# Patient Record
Sex: Female | Born: 1990 | Hispanic: No | Marital: Single | Smoking: Never smoker
Health system: Southern US, Community
[De-identification: ages and names within clinical notes are randomized; demographics above are authoritative.]

---
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
## 2012-01-11 ENCOUNTER — Ambulatory Visit (INDEPENDENT_AMBULATORY_CARE_PROVIDER_SITE_OTHER): Payer: Medicaid Other | Admitting: Family Medicine

## 2012-01-11 ENCOUNTER — Encounter: Payer: Self-pay | Admitting: Family Medicine

## 2012-01-11 VITALS — BP 104/71 | HR 76 | Ht 67.0 in | Wt 175.0 lb

## 2012-01-11 DIAGNOSIS — M25569 Pain in unspecified knee: Secondary | ICD-10-CM

## 2012-01-11 DIAGNOSIS — S82009B Unspecified fracture of unspecified patella, initial encounter for open fracture type I or II: Secondary | ICD-10-CM

## 2012-01-11 DIAGNOSIS — Z00129 Encounter for routine child health examination without abnormal findings: Secondary | ICD-10-CM

## 2012-01-11 NOTE — Patient Instructions (Addendum)
Thank you for coming in today, it was good to see you We will put in a referral for PT and they will contact you to set that up.  F/u in one year or sooner as needed.
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
## 2012-01-14 ENCOUNTER — Ambulatory Visit (INDEPENDENT_AMBULATORY_CARE_PROVIDER_SITE_OTHER): Payer: Medicaid Other | Admitting: *Deleted

## 2012-01-14 DIAGNOSIS — Z289 Immunization not carried out for unspecified reason: Secondary | ICD-10-CM

## 2012-01-14 NOTE — Progress Notes (Signed)
Patient and father in to update immunizations.  Explained that she needs second Varicella , Hep A and   Menactra. Explained these are not required for college form,  but are recommended.  She declines to have immunizations today. Shot record given along with form signed by Dr. Ashley Royalty.

## 2012-01-18 ENCOUNTER — Ambulatory Visit: Payer: Medicaid Other | Attending: Family Medicine

## 2012-01-18 DIAGNOSIS — M25569 Pain in unspecified knee: Secondary | ICD-10-CM | POA: Insufficient documentation

## 2012-01-18 DIAGNOSIS — IMO0001 Reserved for inherently not codable concepts without codable children: Secondary | ICD-10-CM | POA: Insufficient documentation

## 2012-01-18 DIAGNOSIS — R5381 Other malaise: Secondary | ICD-10-CM | POA: Insufficient documentation

## 2012-01-21 ENCOUNTER — Ambulatory Visit: Payer: Medicaid Other | Attending: Family Medicine

## 2012-01-21 DIAGNOSIS — R5381 Other malaise: Secondary | ICD-10-CM | POA: Insufficient documentation

## 2012-01-21 DIAGNOSIS — M25569 Pain in unspecified knee: Secondary | ICD-10-CM | POA: Insufficient documentation

## 2012-01-21 DIAGNOSIS — IMO0001 Reserved for inherently not codable concepts without codable children: Secondary | ICD-10-CM | POA: Insufficient documentation

## 2012-01-25 ENCOUNTER — Ambulatory Visit (INDEPENDENT_AMBULATORY_CARE_PROVIDER_SITE_OTHER): Payer: Medicaid Other | Admitting: *Deleted

## 2012-01-25 ENCOUNTER — Ambulatory Visit: Payer: Medicaid Other | Admitting: Physical Therapy

## 2012-01-25 DIAGNOSIS — Z111 Encounter for screening for respiratory tuberculosis: Secondary | ICD-10-CM

## 2012-01-27 ENCOUNTER — Ambulatory Visit (INDEPENDENT_AMBULATORY_CARE_PROVIDER_SITE_OTHER): Payer: Medicaid Other | Admitting: *Deleted

## 2012-01-27 DIAGNOSIS — IMO0001 Reserved for inherently not codable concepts without codable children: Secondary | ICD-10-CM

## 2012-01-27 DIAGNOSIS — Z111 Encounter for screening for respiratory tuberculosis: Secondary | ICD-10-CM

## 2012-01-27 LAB — TB SKIN TEST: Induration: 0 mm

## 2012-01-28 ENCOUNTER — Ambulatory Visit: Payer: Medicaid Other

## 2012-02-01 ENCOUNTER — Ambulatory Visit: Payer: Medicaid Other | Admitting: Physical Therapy

## 2012-02-04 ENCOUNTER — Ambulatory Visit: Payer: Medicaid Other

## 2012-02-07 ENCOUNTER — Ambulatory Visit: Payer: Medicaid Other

## 2012-02-08 ENCOUNTER — Encounter: Payer: Medicaid Other | Admitting: Physical Therapy

## 2012-02-09 ENCOUNTER — Ambulatory Visit: Payer: Medicaid Other

## 2012-02-14 ENCOUNTER — Ambulatory Visit: Payer: Medicaid Other | Admitting: Physical Therapy

## 2012-02-15 ENCOUNTER — Encounter: Payer: Medicaid Other | Admitting: Physical Therapy

## 2012-02-16 ENCOUNTER — Ambulatory Visit: Payer: Medicaid Other | Admitting: Physical Therapy

## 2012-02-18 ENCOUNTER — Ambulatory Visit: Payer: Medicaid Other

## 2013-05-24 IMAGING — CR DG ANKLE PORT 2V*R*
2 series · 2 of 2 positions shown · non-contrast
Comparison: None.

CLINICAL DATA: Right ankle swelling.

PORTABLE RIGHT ANKLE - 2 VIEW

[view not recorded (1 of 2)]
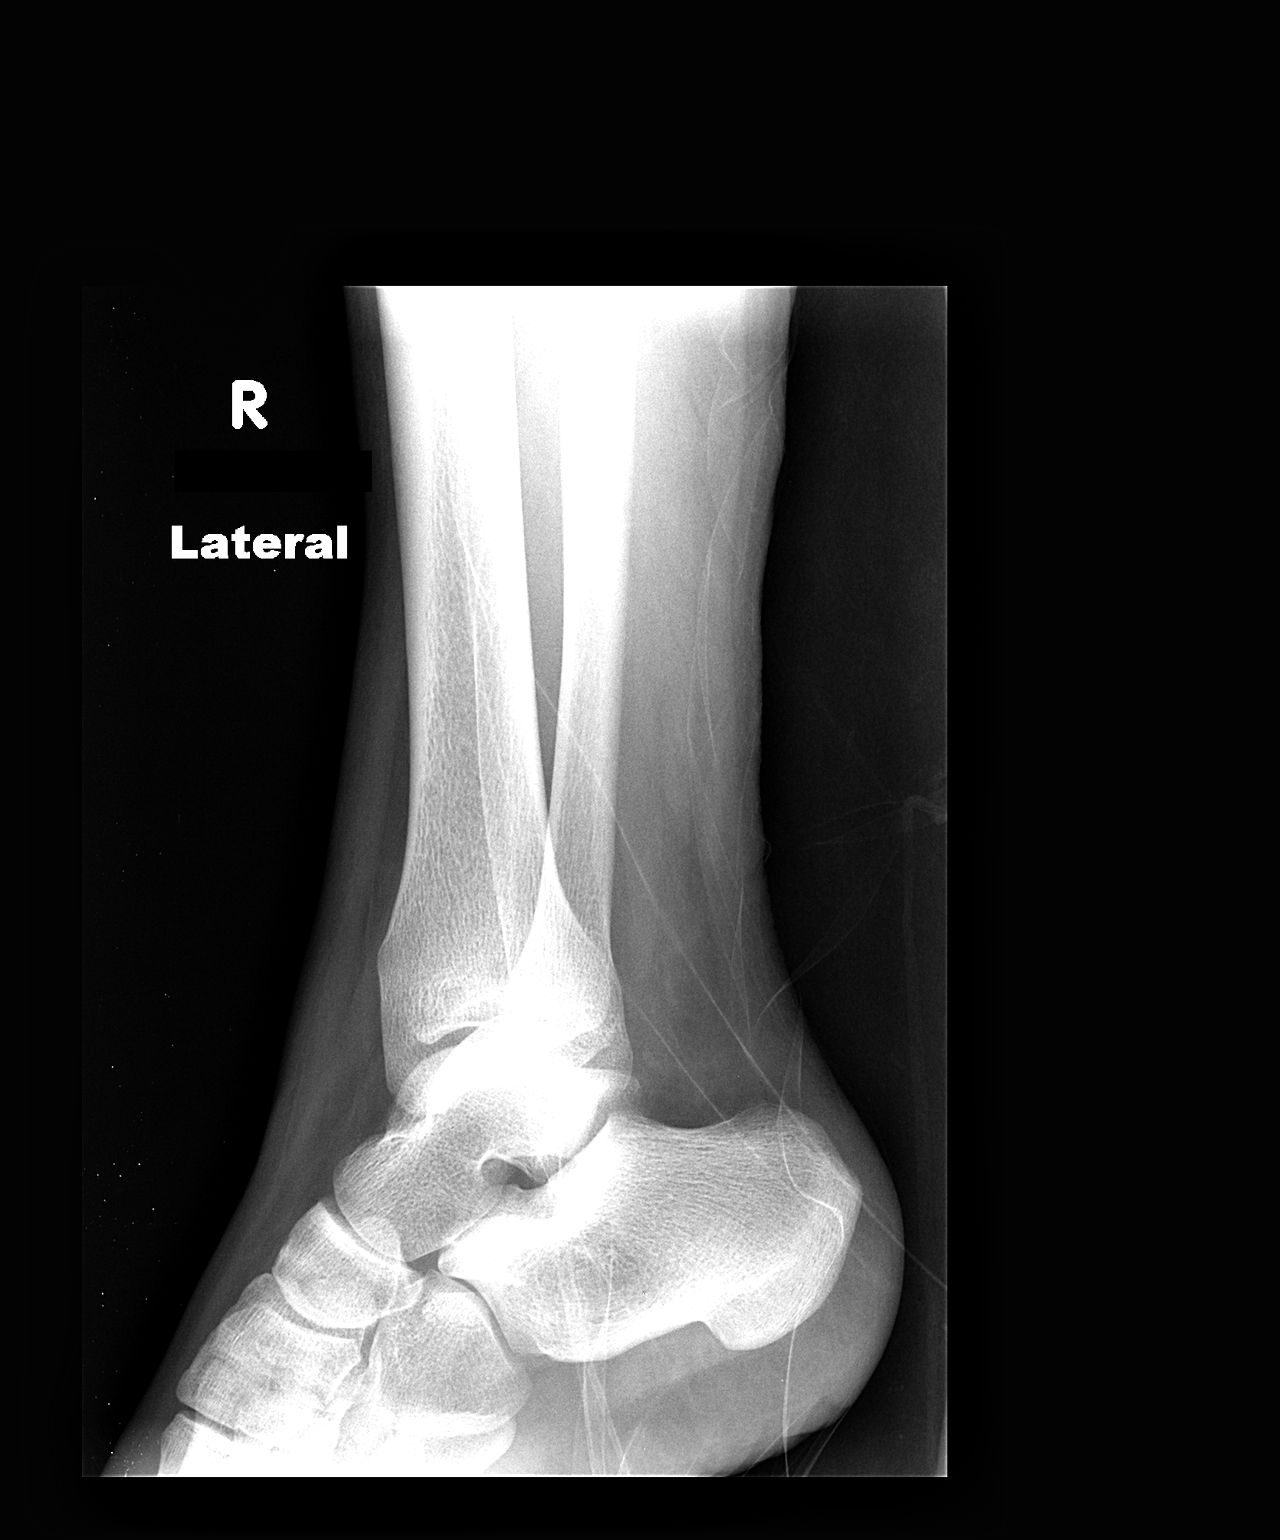

[view not recorded (2 of 2)]
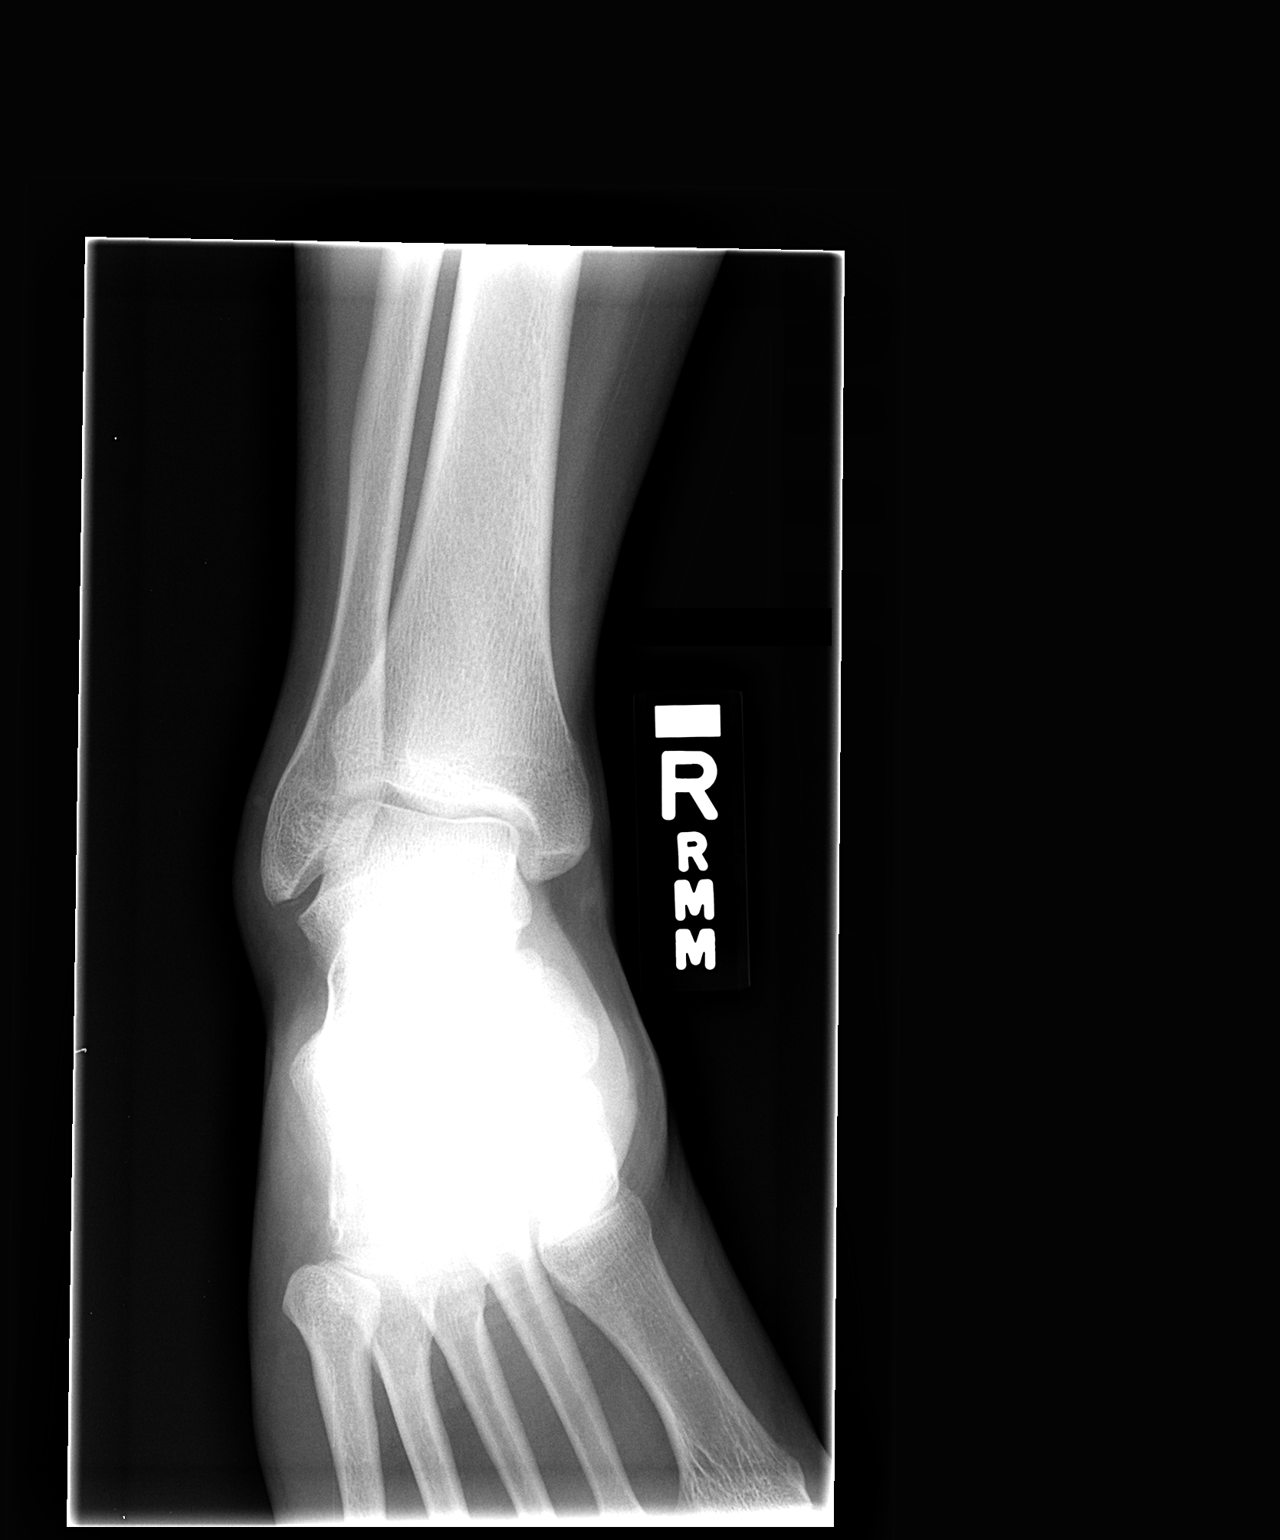

[2 of 2 positions shown; findings below may reference images not displayed]

FINDINGS: No evidence of fracture or dislocation.  No other
significant bone abnormality identified.  Soft tissues are
unremarkable.
IMPRESSION: Negative.

## 2013-11-09 ENCOUNTER — Ambulatory Visit (INDEPENDENT_AMBULATORY_CARE_PROVIDER_SITE_OTHER): Payer: BC Managed Care – PPO | Admitting: Family Medicine

## 2013-11-09 ENCOUNTER — Encounter: Payer: Self-pay | Admitting: Family Medicine

## 2013-11-09 VITALS — BP 105/73 | HR 71 | Temp 98.1°F | Ht 67.0 in | Wt 169.0 lb

## 2013-11-09 DIAGNOSIS — R300 Dysuria: Secondary | ICD-10-CM

## 2013-11-09 DIAGNOSIS — L293 Anogenital pruritus, unspecified: Secondary | ICD-10-CM

## 2013-11-09 DIAGNOSIS — L292 Pruritus vulvae: Secondary | ICD-10-CM

## 2013-11-09 LAB — POCT URINALYSIS DIPSTICK
Bilirubin, UA: NEGATIVE
GLUCOSE UA: NEGATIVE
Ketones, UA: NEGATIVE
NITRITE UA: NEGATIVE
Protein, UA: NEGATIVE
SPEC GRAV UA: 1.025
UROBILINOGEN UA: 0.2
pH, UA: 7

## 2013-11-09 LAB — POCT UA - MICROSCOPIC ONLY

## 2013-11-09 MED ORDER — MICONAZOLE NITRATE 2 % VA CREA: 1.0000 | TOPICAL_CREAM | Freq: Every day | VAGINAL | Status: AC

## 2013-11-09 NOTE — Patient Instructions (Addendum)
Use medication (monistat) as prescribed. If you develop burning with urination, flank pain, nausea or vomiting please come for re-evaluation.

## 2013-11-09 NOTE — Progress Notes (Signed)
Family Medicine Office Visit Note   Subjective:   Patient ID: [name], female  DOB: 29-Oct-1990, 23 y.o.. MRN: [id number]   Pt that comes today complaining of vulvar itching. She reports dysuria yesterday but this has completely resolved. She is finalizing her menses and denies frequency, flank pain, nausea or vomiting. No fevers, chills or vaginal discharge.  Review of Systems:  Per HPI  Objective:   Physical Exam: Gen:  NAD HEENT: Moist mucous membranes  CV: Regular rate and rhythm, no murmurs rubs or gallops PULM: Clear to auscultation bilaterally.  ABD: Soft, non tender, non distended, normal bowel sounds. No CVA tenderness. EXT: No edema Neuro: Alert and oriented x3. No focalization GYN: declined.   Assessment & Plan:

## 2013-11-09 NOTE — Assessment & Plan Note (Signed)
UA w/ WBC and RBC's but pt is finalizing her menses, this can be contamination of her clean catch. Asymptomatic today.  No treatment needed at this time but discussed signs for which treatment would be recommended.

## 2013-11-09 NOTE — Assessment & Plan Note (Signed)
Denies discharge, declines examination.  Monistat cream prescribed.

## 2016-12-02 ENCOUNTER — Encounter (HOSPITAL_COMMUNITY): Payer: Self-pay | Admitting: Nurse Practitioner

## 2016-12-02 ENCOUNTER — Emergency Department (HOSPITAL_COMMUNITY): Payer: Worker's Compensation

## 2016-12-02 ENCOUNTER — Ambulatory Visit: Payer: Self-pay | Admitting: Family Medicine

## 2016-12-02 ENCOUNTER — Emergency Department (HOSPITAL_COMMUNITY)
Admission: EM | Admit: 2016-12-02 | Discharge: 2016-12-02 | Disposition: A | Discharge status: Home / Self Care | Payer: Worker's Compensation | Attending: Emergency Medicine | Admitting: Emergency Medicine

## 2016-12-02 DIAGNOSIS — W268XXA Contact with other sharp object(s), not elsewhere classified, initial encounter: Secondary | ICD-10-CM | POA: Insufficient documentation

## 2016-12-02 DIAGNOSIS — S91331A Puncture wound without foreign body, right foot, initial encounter: Secondary | ICD-10-CM

## 2016-12-02 DIAGNOSIS — Y929 Unspecified place or not applicable: Secondary | ICD-10-CM | POA: Insufficient documentation

## 2016-12-02 DIAGNOSIS — Y999 Unspecified external cause status: Secondary | ICD-10-CM | POA: Insufficient documentation

## 2016-12-02 DIAGNOSIS — Z23 Encounter for immunization: Secondary | ICD-10-CM | POA: Insufficient documentation

## 2016-12-02 DIAGNOSIS — Y939 Activity, unspecified: Secondary | ICD-10-CM | POA: Diagnosis not present

## 2016-12-02 DIAGNOSIS — S99921A Unspecified injury of right foot, initial encounter: Secondary | ICD-10-CM | POA: Diagnosis present

## 2016-12-02 MED ORDER — TETANUS-DIPHTH-ACELL PERTUSSIS 5-2.5-18.5 LF-MCG/0.5 IM SUSP
0.5000 mL | Freq: Once | INTRAMUSCULAR | Status: AC
  Administered 2016-12-02: 0.5 mL via INTRAMUSCULAR
  Filled 2016-12-02: qty 0.5

## 2016-12-02 NOTE — Discharge Instructions (Signed)
Take Ibuprofen or Naproxen for pain as needed Keep area clean and dry and use an antibiotic ointment daily Use crutches as needed. Once you can put weight on your foot you don't have to use them anymore [facility] if you continue to have symptoms after a week

## 2016-12-02 NOTE — ED Triage Notes (Signed)
Pt presents with c/o right foot injury. The injury occurred this afternoon when she stepped on a food skewer which became impaled in the bottom of her foot. She removed the skewer and wrapped with gauze. She reports severe pain, numbness, tingling now

## 2016-12-02 NOTE — ED Provider Notes (Signed)
[facility]-EMERGENCY DEPT Provider Note   CSN: [id number] Arrival date & time: 12/02/16  1511  By signing my name below, I, [name], attest that this documentation has been prepared under the direction and in the presence of Terance Hart, PA-C. Electronically Signed: [name], ED Scribe. 12/02/16. 4:44 PM.  History   Chief Complaint Chief Complaint  Patient presents with  . Foot Injury    HPI [name] is a 26 y.o. female who presents to the Emergency Department complaining of moderate right foot injury that occurred ~ 1:40 pm at work. 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 She removed the skewer and wrapped her foot in gauze after cleaning the area with alcohol. Pt reports numbness and tingling. Pt was not able to ambulate after onset occurred. Only takes BCP. Last tetanus shot was in the summer of 2013. Pt denies fever or any other sx at this time.   The history is provided by the patient. No language interpreter was used.    History reviewed. No pertinent past medical history.  Patient Active Problem List   Diagnosis Date Noted  . Vulvar itching 11/09/2013  . Dysuria 11/09/2013  . Fracture, patella, open 07/16/2011  . CONSTIPATION 01/16/2010  . COSTOCHONDRITIS 09/03/2009    Past Surgical History:  Procedure Laterality Date  . IRRIGATION AND DEBRIDEMENT KNEE  07/16/2011   Procedure: IRRIGATION AND DEBRIDEMENT KNEE;  Surgeon: Verlee Rossetti, MD;  Location: [facility] OR;  Service: Orthopedics;  Laterality: Left;    OB History    No data available       Home Medications    Prior to Admission medications   Medication Sig Start Date End Date Taking? Authorizing Provider  miconazole (MONISTAT 7) 2 % vaginal cream Place 1 Applicatorful vaginally at bedtime. 11/09/13   [place] Criselda Peaches, Dayarmys, MD  polyethylene glycol [facility]) powder Take 17 g by mouth daily as  needed. For constipation    [provider]    Family History History reviewed. No pertinent family history.  Social History Social History  Substance Use Topics  . Smoking status: Never Smoker  . Smokeless tobacco: Never Used  . Alcohol use Yes     Allergies   Patient has no known allergies.   Review of Systems Review of Systems  Constitutional: Negative for fever.  Skin: Positive for wound.  Neurological: Positive for numbness.     Physical Exam Updated Vital Signs BP (!) 132/114   Pulse 87   Temp 98.3 F (36.8 C) (Oral)   Resp 18   SpO2 99%   Physical Exam  Constitutional: She is oriented to person, place, and time. She appears well-developed and well-nourished. No distress.  HENT:  Head: Normocephalic and atraumatic.  Eyes: Conjunctivae and EOM are normal. Pupils are equal, round, and reactive to light. Right eye exhibits no discharge. Left eye exhibits no discharge. No scleral icterus.  Neck: Normal range of motion.  Cardiovascular: Normal rate.   Pulmonary/Chest: Effort normal. No respiratory distress.  Abdominal: She exhibits no distension.  Musculoskeletal: Normal range of motion.  Right foot: Small puncture wound in between the right foot great toe an second toe. Tenderness to palpation of the great toe, second toe, third toe and dorsal aspect of foot. Able to wiggle all toes. N/V intact  Neurological: She is alert and oriented to person, place, and time.  Skin: Skin is warm and dry.  Psychiatric: She has a normal mood and affect. Her behavior is normal.  Nursing note and vitals reviewed.    ED Treatments / Results  DIAGNOSTIC STUDIES: Oxygen Saturation is 99% on RA, normal by my interpretation.   COORDINATION OF CARE: 4:44 PM-Discussed next steps with pt which includes watching the wound to make sure it doesn't get infected. She is to [facility] if sx don't improve. Pt verbalized understanding and is agreeable with  the plan.   Labs (all labs ordered are listed, but only abnormal results are displayed) Labs Reviewed - No data to display  EKG  EKG Interpretation None       Radiology Dg Foot Complete Right  Result Date: 12/02/2016 CLINICAL DATA:  Initial evaluation for acute pain after foreign body removal. EXAM: RIGHT FOOT COMPLETE - 3+ VIEW COMPARISON:  None. FINDINGS: There is no evidence of fracture or dislocation. There is no evidence of arthropathy or other focal bone abnormality. Soft tissues demonstrate no acute abnormality. No radiopaque foreign body. IMPRESSION: No radiographic evidence for acute traumatic injury about the foot. Electronically Signed   By: Rise MuBenjamin  McClintock M.D.   On: 12/02/2016 16:33    Procedures Procedures (including critical care time)  Medications Ordered in ED Medications - No data to display   Initial Impression / Assessment and Plan / ED Course  I have reviewed the triage vital signs and the nursing notes.  Pertinent labs & imaging results that were available during my care of the patient were reviewed by me and considered in my medical decision making (see chart for details).  26 year old female with puncture wound to the right foot. X-rays negative for retained foreign body. Advised conservative measures including rest, NSAIDs, wound care. Referral to podiatry given if symptoms are not improving. Advised to return for infectious symptoms are pain is worsening.  Final Clinical Impressions(s) / ED Diagnoses   Final diagnoses:  Puncture wound of plantar aspect of right foot, initial encounter    New Prescriptions New Prescriptions   No medications on file   I personally performed the services described in this documentation, which was scribed in my presence. The recorded information has been reviewed and is accurate.     Bethel BornGekas, Kelly Marie, PA-C 12/02/16 2102    Shaune PollackIsaacs, Cameron, MD 12/04/16 1356

## 2018-10-11 IMAGING — DX DG FOOT COMPLETE 3+V*R*
3 series · 3 of 3 positions shown · non-contrast
Comparison: None.

CLINICAL DATA: Initial evaluation for acute pain after foreign body
removal.

EXAM:
RIGHT FOOT COMPLETE - 3+ VIEW

[foot ap]
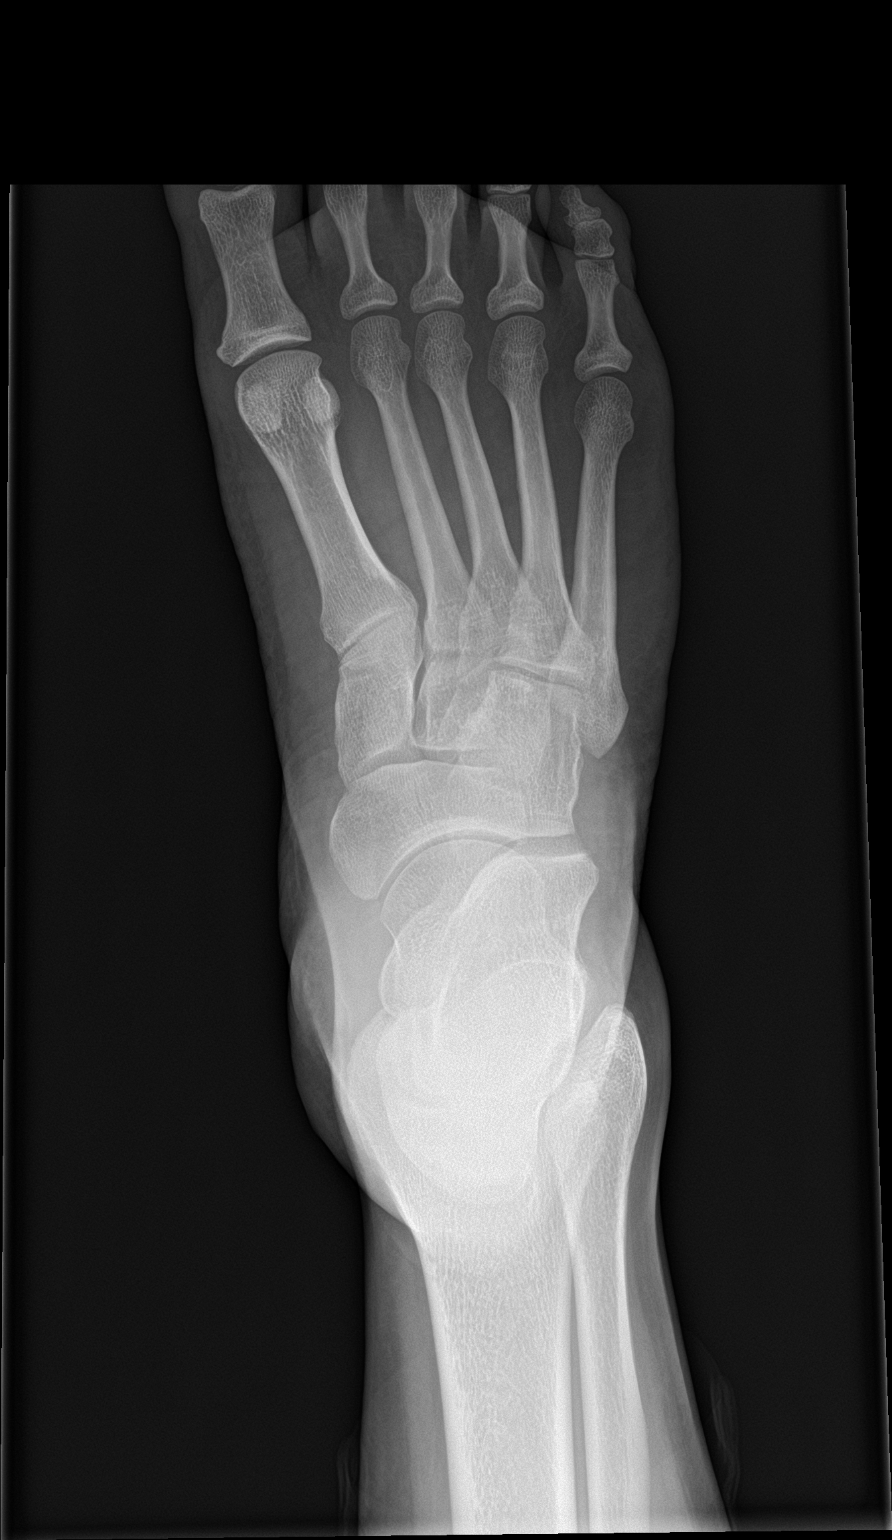

[foot obl]
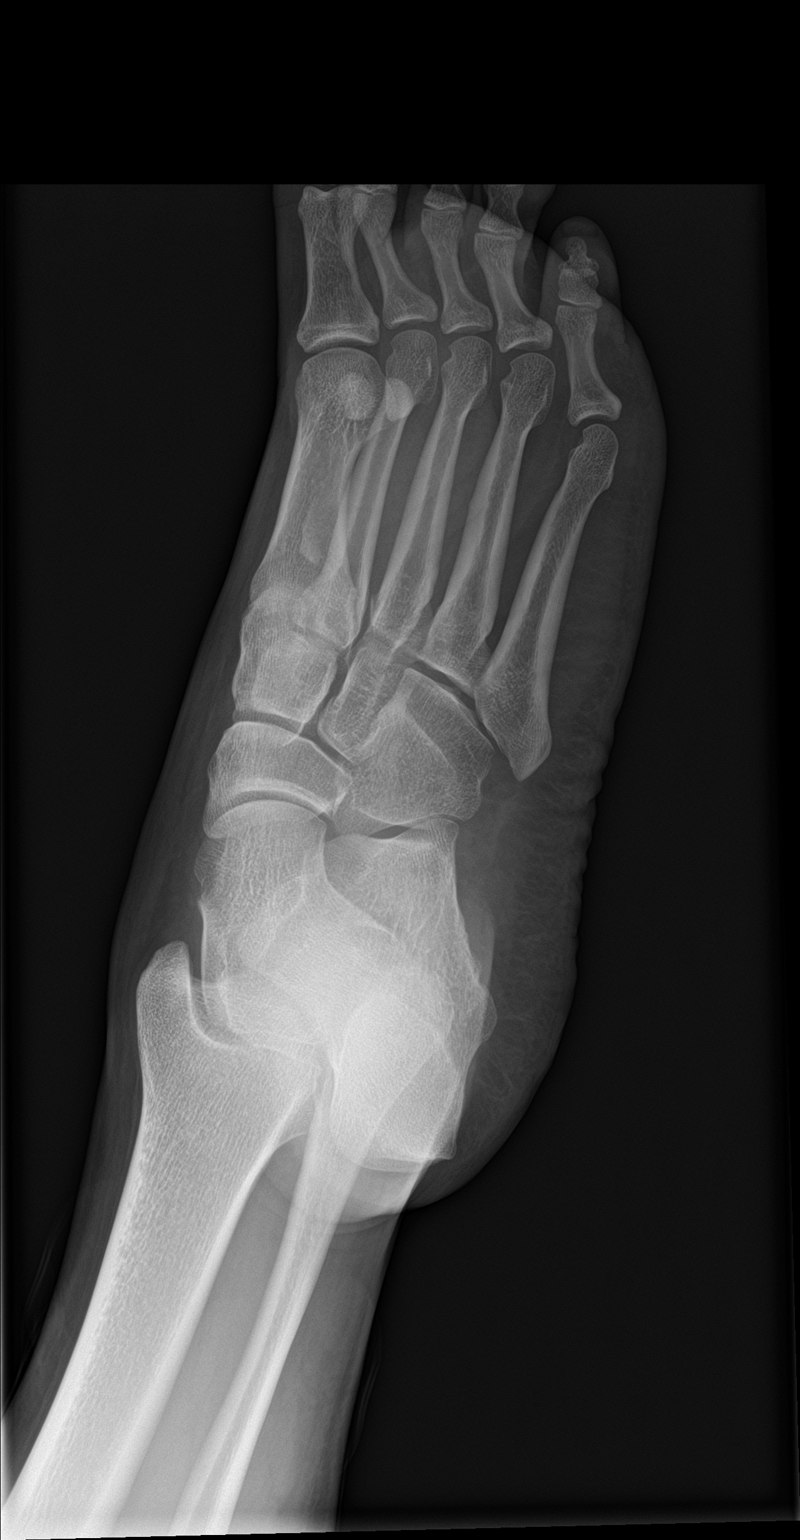

[foot lat]
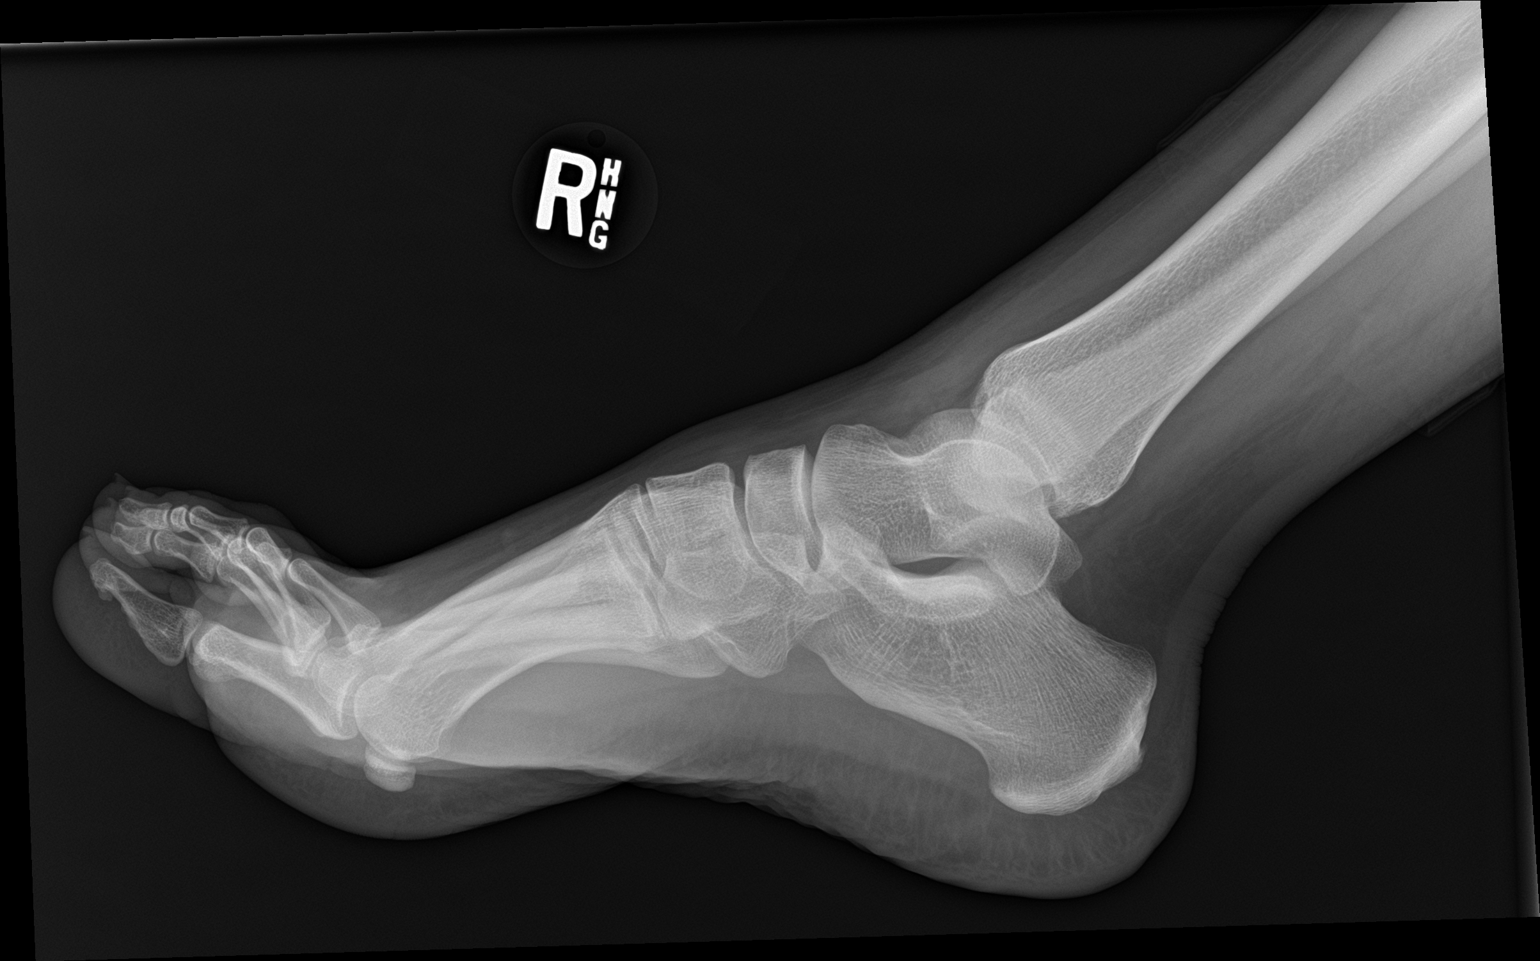

[3 of 3 positions shown; findings below may reference images not displayed]

FINDINGS: There is no evidence of fracture or dislocation. There is no
evidence of arthropathy or other focal bone abnormality. Soft
tissues demonstrate no acute abnormality. No radiopaque foreign
body.
IMPRESSION: No radiographic evidence for acute traumatic injury about the foot.
